# Patient Record
Sex: Male | Born: 1955 | Race: White | Hispanic: No
Health system: Midwestern US, Academic
[De-identification: ages and names within clinical notes are randomized; demographics above are authoritative.]

---

## 2017-01-06 ENCOUNTER — Encounter: Admit: 2017-01-06 | Discharge: 2017-01-06 | Payer: BC Managed Care – HMO

## 2017-01-06 ENCOUNTER — Encounter: Admit: 2017-01-06 | Discharge: 2017-01-06 | Payer: Private Health Insurance - Indemnity

## 2017-01-06 DIAGNOSIS — R5383 Other fatigue: ICD-10-CM

## 2017-01-06 DIAGNOSIS — I493 Ventricular premature depolarization: Principal | ICD-10-CM

## 2017-01-13 ENCOUNTER — Encounter: Admit: 2017-01-13 | Discharge: 2017-01-13 | Payer: BC Managed Care – HMO

## 2017-01-13 ENCOUNTER — Ambulatory Visit: Admit: 2017-01-13 | Discharge: 2017-01-14 | Payer: BC Managed Care – HMO

## 2017-01-13 DIAGNOSIS — I493 Ventricular premature depolarization: ICD-10-CM

## 2017-01-13 DIAGNOSIS — B2790 Infectious mononucleosis, unspecified without complication: ICD-10-CM

## 2017-01-13 DIAGNOSIS — R5383 Other fatigue: ICD-10-CM

## 2017-01-13 DIAGNOSIS — R002 Palpitations: Principal | ICD-10-CM

## 2017-01-13 NOTE — Progress Notes
Records Request    Medical records request for continuation of care:      [facility]  [address]  [place], [place]    Request records:    Echocardiogram CD- June 24, 2016    Thank you,      [facility]  [facility]  [address]  [place], [place] [id number]  Phone:  [phone]  Fax:  [phone]

## 2017-01-13 NOTE — Assessment & Plan Note
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  He will probably need a cardiac MR.  We may be able to effectively rule out CAD with this sort of imaging study; his risk for CAD as the cause of his PVC's would seem to be quite low.

## 2017-01-13 NOTE — Assessment & Plan Note
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  Beta-blocker medications have not been effective in suppressing symptoms.  While we could consider the use of a 1-C agent (after conclusively ruling out CAD) he may actually be a good candidate for a PVC ablation.  I will discuss his case with Dr. Betti Cruzeddy and get back with the patient about next-steps.

## 2017-01-13 NOTE — Progress Notes
Date of Service: 01/13/2017    Oscar Sweeney is a 61 y.o. male.       HPI     Oscar Sweeney was in the [place] office today for consultation regarding symptomatic PVCs.  He was accompanied by his future son-in-law, Dr. Lona Kettle.    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    About a month later he was out on his motorcycle on a relatively hot day when he noticed the onset of bothersome palpitation symptoms.  The palpitation symptoms have persisted and, in fact, have increased in frequency since that time.    He says that the PVCs tend to be brought on by exposure to hot weather.  He can get the PVCs in the winter months but he said he has to work really hard for them to start.  If he relaxes and tries to settle down shortly after the onset of palpitation symptoms the palpitations may resolve fairly quickly.  If he cannot rest and the PVCs progressed to the point that he is having what he describes as bigeminy, it may take hours for the palpitations to actually resolved.  He occasionally notes the PVC's if he gets up at night to urinate.    He says that he has a dry cough and feels like he has a lot of phlegm while he is having the frequent PVCs.  He has not had lightheadedness or syncope nor does he have any sort of anginal symptoms.  He says that right at the beginning of the PVCs he has the sensation that his lungs are filling with phlegm.    He has a remote history of palpitations while going through a divorce about 20 years ago but usually he does not have any cardiovascular symptoms at all.  He denies any problems with anginal chest discomfort.  His never had any sort of TIA or stroke symptoms.  Denies any history of syncope or near syncope.      He had been taking Ziac last year but was switched to [facility] in February when he had a Holter monitor that showed ~13% PVC burden.  He says that he actually tried to provoke the PVCs the day he wore the monitor and was a lot more active than usual.  For a while he was taking the Bystolic on an as-needed basis but Dr. Herschell Dimes suggested that he take it on a twice daily basis regularly and he has been doing this.  Despite the fact that he has been taking the beta-blocker pretty regularly he continues to have a lot of trouble with symptomatic PVCs.    He also had an echocardiogram in February that was essentially normal.  There was a comment about diastolic abnormality without quantification.    He was able to go climbing 14'ers in [place] this past summer with his daughter and son-in-law.  He took a lot more by Bystolic at the time (every 5 hours!) but did not have any particular problems with PVCs while climbing.    Yesterday the palpitations started at 4 PM.  He took his Bystolic at 6 and they still hadn't resolved by 8.  He took another [employer] development [employer] and by about 10 his symptoms had resolved.         Vitals:    01/13/17 1055 01/13/17 1103  BP: (!) 170/92 (!) 162/96   Pulse: 49    Weight: 75.8 kg (167 lb)    Height: 1.88 m (6' 2)      Body mass index is 21.44 kg/m???.     Past Medical History  Patient Active Problem List    Diagnosis Date Noted   ??? 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  Presented with fatigue.     ??? PVC's (premature ventricular contractions) 01/06/2017     2017 - Palpitation symptoms started several months after EB virus infection.  Ziac started initially.    06/24/2016  Echo:  EF 55%.  Diastolic function is abnormal.  Right atrium is enlarged.  Trace mitral regurg.  Minimal tricuspid regurg.  Mild pulmonic valvular regurg.  Aortic root is normal.  No pericardial effusion.  No pleural effusion present.     06/2016:  Bystolic substituted for Ziac.    06/22/2016:  24-hour Holter:  Predominant rhythm was a sinus rhythm with an average heart rate of 63 bpm.  Heart rates ranging from 45 bpm to 97 bpm.  Frequent PVCs with a 13.4 % burden.  Rare supraventricular ectopy.  No sustained arrhythmia.  No significant pauses.  Symptoms of palpitations corresponded to SR, at times with PVCs.  (Patient tried to provoke PVC's with increased activity).           Review of Systems   Constitution: Positive for malaise/fatigue.   HENT: Negative.    Eyes: Negative.    Cardiovascular: Positive for palpitations.   Respiratory: Positive for cough.    Endocrine: Positive for heat intolerance.   Hematologic/Lymphatic: Negative.    Skin: Negative.    Musculoskeletal: Negative.    Gastrointestinal: Negative.    Genitourinary: Negative.    Neurological: Negative.    Psychiatric/Behavioral: Negative.    Allergic/Immunologic: Negative.        Physical Exam    Physical Exam   General Appearance: no distress   Skin: warm, no ulcers or xanthomas   Digits and Nails: no cyanosis or clubbing   Eyes: conjunctivae and lids normal, pupils are equal and round   Teeth/Gums/Palate: dentition unremarkable, no lesions   Lips & Oral Mucosa: no pallor or cyanosis   Neck Veins: normal JVP , neck veins are not distended   Thyroid: no nodules, masses, tenderness or enlargement   Chest Inspection: chest is normal in appearance   Respiratory Effort: breathing comfortably, no respiratory distress   Auscultation/Percussion: lungs clear to auscultation, no rales or rhonchi, no wheezing   PMI: PMI not enlarged or displaced   Cardiac Rhythm: regular rhythm and normal rate   Cardiac Auscultation: S1, S2 normal, no rub, no gallop   Murmurs: no murmur   Peripheral Circulation: normal peripheral circulation   Carotid Arteries: normal carotid upstroke bilaterally, no bruits   Radial Arteries: normal symmetric radial pulses   Abdominal Aorta: no abdominal aortic bruit   Pedal Pulses: normal symmetric pedal pulses   Lower Extremity Edema: no lower extremity edema Abdominal Exam: soft, non-tender, no masses, bowel sounds normal   Liver & Spleen: no organomegaly   Gait & Station: walks without assistance   Muscle Strength: normal muscle tone   Orientation: oriented to time, place and person   Affect & Mood: appropriate and sustained affect   Language and Memory: patient responsive and seems to comprehend information   Neurologic Exam: neurological assessment grossly intact   Other: moves all extremities  Cardiovascular Studies    EKG:  Sinus rhythm, rate 49.  Normal EKG.    Problems Addressed Today  Encounter Diagnoses   Name Primary?   ??? Palpitations    ??? PVC's (premature ventricular contractions)    ??? 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  Beta-blocker medications have not been effective in suppressing symptoms.  While we could consider the use of a 1-C agent (after conclusively ruling out CAD) he may actually be a good candidate for a PVC ablation.  I will discuss his case with Dr. Betti Cruz and get back with the patient about next-steps.    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  He will probably need a cardiac MR.  We may be able to effectively rule out CAD with this sort of imaging study; his risk for CAD as the cause of his PVC's would seem to be quite low.      Current Medications (including today's revisions)  ??? ALPRAZolam (XANAX) 0.5 mg tablet Take 0.25 mg by mouth as Needed.   ??? aspirin EC 81 mg tablet Take 81 mg by mouth daily. Take with food.   ??? MULTIVITAMIN PO Take 1 tablet by mouth daily.   ??? nebivolol (BYSTOLIC) 10 mg tablet Take 10 mg by mouth daily.

## 2017-03-07 ENCOUNTER — Encounter: Admit: 2017-03-07 | Discharge: 2017-03-07 | Payer: BC Managed Care – HMO

## 2017-03-07 DIAGNOSIS — I493 Ventricular premature depolarization: Principal | ICD-10-CM

## 2017-03-07 MED ORDER — LIDOCAINE (PF) 10 MG/ML (1 %) IJ SOLN
.1-2 mL | INTRAMUSCULAR | 0 refills | Status: CN | PRN
Start: 2017-03-07 — End: ?

## 2017-03-07 MED ORDER — LIDOCAINE HCL 2 % MM JELP
Freq: Once | TOPICAL | 0 refills | Status: CN
Start: 2017-03-07 — End: ?

## 2017-03-07 MED ORDER — SODIUM CHLORIDE 0.9 % IV SOLP
INTRAVENOUS | 0 refills | Status: CN
Start: 2017-03-07 — End: ?

## 2017-03-08 ENCOUNTER — Encounter: Admit: 2017-03-08 | Discharge: 2017-03-08 | Payer: Private Health Insurance - Indemnity

## 2017-03-08 DIAGNOSIS — I493 Ventricular premature depolarization: Principal | ICD-10-CM

## 2017-03-08 NOTE — Progress Notes
10-30 [employer] 1 [phone] [id number] regarding [id number] spoke Lorel Monaco.   No Pre Cert needed REF# [id number] LM, patients effective date 05-10-2014

## 2017-03-18 ENCOUNTER — Encounter: Admit: 2017-03-18 | Discharge: 2017-03-18 | Payer: BC Managed Care – HMO

## 2017-03-18 ENCOUNTER — Ambulatory Visit: Admit: 2017-03-18 | Discharge: 2017-03-18 | Payer: BC Managed Care – HMO

## 2017-03-18 DIAGNOSIS — I493 Ventricular premature depolarization: Principal | ICD-10-CM

## 2017-03-18 DIAGNOSIS — R5383 Other fatigue: ICD-10-CM

## 2017-03-25 ENCOUNTER — Ambulatory Visit: Admit: 2017-03-25 | Discharge: 2017-03-26 | Payer: BC Managed Care – HMO

## 2017-03-25 ENCOUNTER — Encounter: Admit: 2017-03-25 | Discharge: 2017-03-25 | Payer: BC Managed Care – HMO

## 2017-03-25 DIAGNOSIS — I493 Ventricular premature depolarization: Principal | ICD-10-CM

## 2017-03-26 ENCOUNTER — Encounter: Admit: 2017-03-26 | Discharge: 2017-03-26 | Payer: Private Health Insurance - Indemnity

## 2017-03-27 NOTE — Progress Notes
Pt enrolled with cardio net. SN # listed for hook-up was not one of the two options on cardio net. I selected [id number].

## 2017-04-06 ENCOUNTER — Encounter: Admit: 2017-04-06 | Discharge: 2017-04-06 | Payer: Private Health Insurance - Indemnity

## 2017-04-14 ENCOUNTER — Encounter: Admit: 2017-04-14 | Discharge: 2017-04-14 | Payer: Private Health Insurance - Indemnity

## 2017-04-14 NOTE — Progress Notes
Overall normal Holter.  Minor issues with low burden of PVCs.  No arrhythmias noted many times even with symptoms.

## 2017-05-23 ENCOUNTER — Ambulatory Visit: Admit: 2017-05-23 | Discharge: 2017-05-24 | Payer: BC Managed Care – HMO

## 2017-05-23 ENCOUNTER — Encounter: Admit: 2017-05-23 | Discharge: 2017-05-23 | Payer: Private Health Insurance - Indemnity

## 2017-05-23 DIAGNOSIS — I493 Ventricular premature depolarization: Principal | ICD-10-CM

## 2017-05-23 DIAGNOSIS — R5383 Other fatigue: ICD-10-CM

## 2017-05-23 MED ORDER — FLECAINIDE 50 MG PO TAB
50 mg | ORAL_TABLET | Freq: Two times a day (BID) | ORAL | 3 refills | 30.00000 days | Status: AC
Start: 2017-05-23 — End: 2017-08-10

## 2017-05-23 MED ORDER — BISOPROLOL-HYDROCHLOROTHIAZIDE 5-6.25 MG PO TAB
1 | ORAL_TABLET | Freq: Every day | ORAL | 3 refills | Status: AC
Start: 2017-05-23 — End: ?

## 2017-05-27 ENCOUNTER — Encounter: Admit: 2017-05-27 | Discharge: 2017-05-27 | Payer: Private Health Insurance - Indemnity

## 2017-06-08 ENCOUNTER — Ambulatory Visit: Admit: 2017-06-08 | Discharge: 2017-06-08 | Payer: BC Managed Care – HMO

## 2017-06-08 DIAGNOSIS — I493 Ventricular premature depolarization: Principal | ICD-10-CM

## 2017-06-08 LAB — POC CREATININE, RAD: Lab: 0.8 mg/dL (ref 0.4–1.24)

## 2017-06-08 MED ORDER — SODIUM CHLORIDE 0.9 % IJ SOLN
50 mL | Freq: Once | INTRAVENOUS | 0 refills | Status: CP
Start: 2017-06-08 — End: ?
  Administered 2017-06-08: 16:00:00 50 mL via INTRAVENOUS

## 2017-06-08 MED ORDER — GADOBENATE DIMEGLUMINE 529 MG/ML (0.1MMOL/0.2ML) IV SOLN
30 mL | Freq: Once | INTRAVENOUS | 0 refills | Status: CP
Start: 2017-06-08 — End: ?
  Administered 2017-06-08: 16:00:00 30 mL via INTRAVENOUS

## 2017-06-14 ENCOUNTER — Encounter: Admit: 2017-06-14 | Discharge: 2017-06-14 | Payer: BC Managed Care – HMO

## 2017-06-16 ENCOUNTER — Encounter: Admit: 2017-06-16 | Discharge: 2017-06-16 | Payer: BC Managed Care – HMO

## 2017-06-17 ENCOUNTER — Encounter: Admit: 2017-06-17 | Discharge: 2017-06-17 | Payer: BC Managed Care – HMO

## 2017-08-10 ENCOUNTER — Ambulatory Visit: Admit: 2017-08-10 | Discharge: 2017-08-11 | Payer: BC Managed Care – HMO

## 2017-08-10 ENCOUNTER — Encounter: Admit: 2017-08-10 | Discharge: 2017-08-10 | Payer: Private Health Insurance - Indemnity

## 2017-08-10 DIAGNOSIS — I493 Ventricular premature depolarization: Principal | ICD-10-CM

## 2017-08-10 DIAGNOSIS — R5383 Other fatigue: ICD-10-CM

## 2017-08-10 MED ORDER — FLECAINIDE 50 MG PO TAB
75 mg | ORAL_TABLET | Freq: Two times a day (BID) | ORAL | 3 refills | 30.00000 days | Status: AC
Start: 2017-08-10 — End: 2017-11-16

## 2017-11-16 ENCOUNTER — Encounter: Admit: 2017-11-16 | Discharge: 2017-11-16 | Payer: BC Managed Care – HMO

## 2017-11-16 ENCOUNTER — Ambulatory Visit: Admit: 2017-11-16 | Discharge: 2017-11-17 | Payer: BC Managed Care – HMO

## 2017-11-16 DIAGNOSIS — I493 Ventricular premature depolarization: Principal | ICD-10-CM

## 2017-11-16 DIAGNOSIS — R002 Palpitations: ICD-10-CM

## 2017-11-16 DIAGNOSIS — R5383 Other fatigue: ICD-10-CM

## 2017-11-16 MED ORDER — FLECAINIDE 100 MG PO TAB
100 mg | ORAL_TABLET | Freq: Two times a day (BID) | ORAL | 3 refills | 30.00000 days | Status: AC
Start: 2017-11-16 — End: 2018-11-08

## 2018-05-26 IMAGING — CT Abdomen^1_ABDOMEN_PELVIS_WITH (Adult)
1 series · 15 of 32 positions shown, 19 images · non-contrast
Comparison: none

[Series 2: abd/pelvis with 5.0 soft tissue · axial · 0.72mm/px · z∈[-462,-72]mm · 15 of 87 slices shown, 19 images]
[im 6/87  soft-tissue]
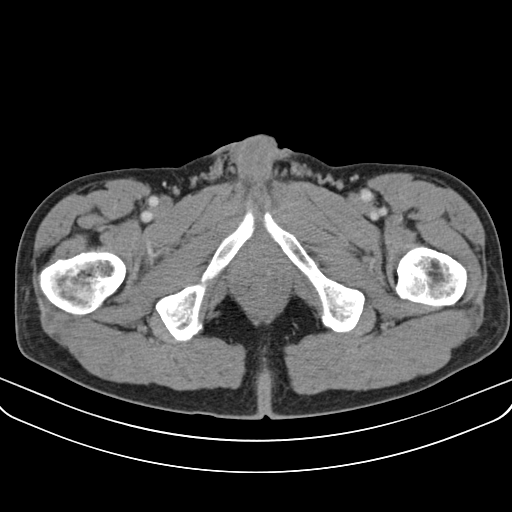
[im 6/87  bone]
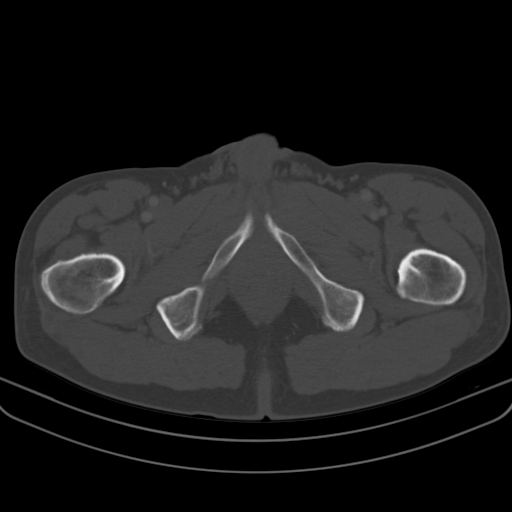
[im 12/87  soft-tissue]
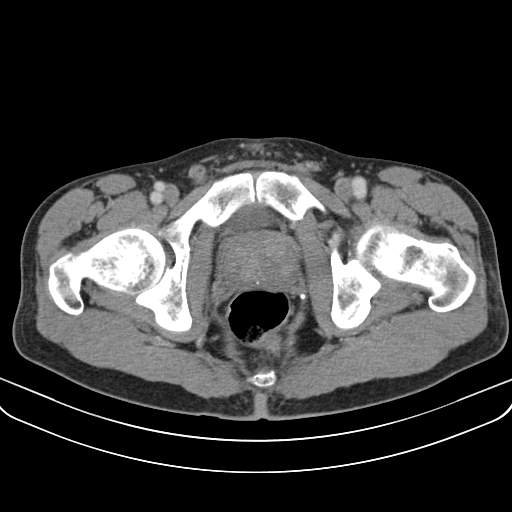
[im 17/87  soft-tissue]
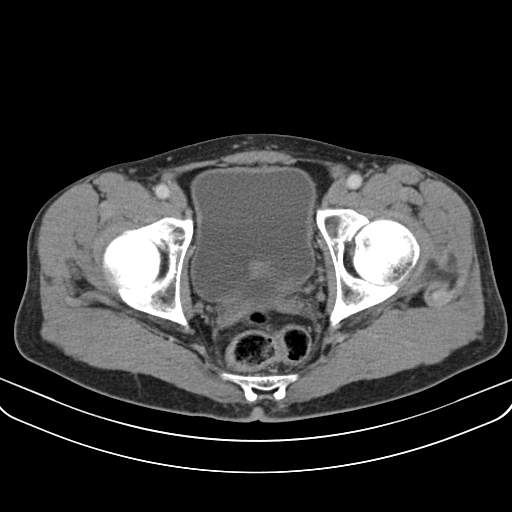
[im 25/87  soft-tissue]
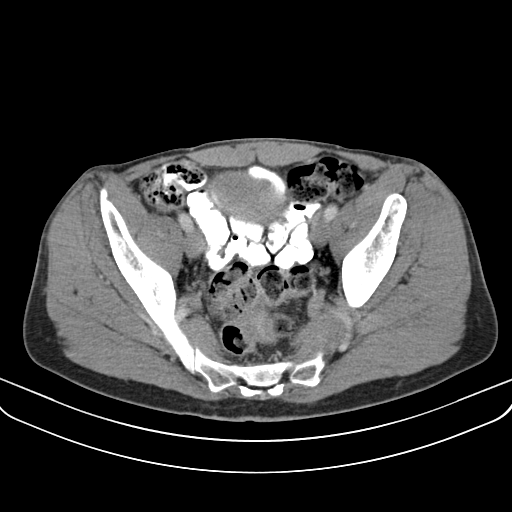
[im 31/87  soft-tissue]
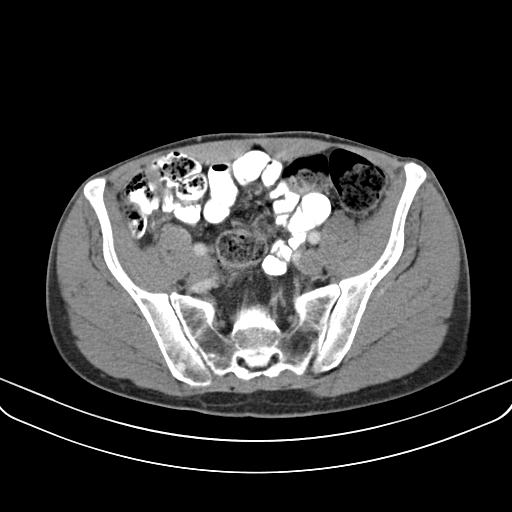
[im 37/87  soft-tissue]
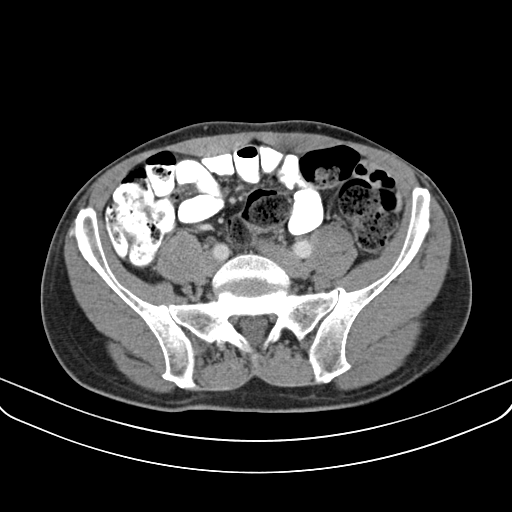
[im 45/87  soft-tissue]
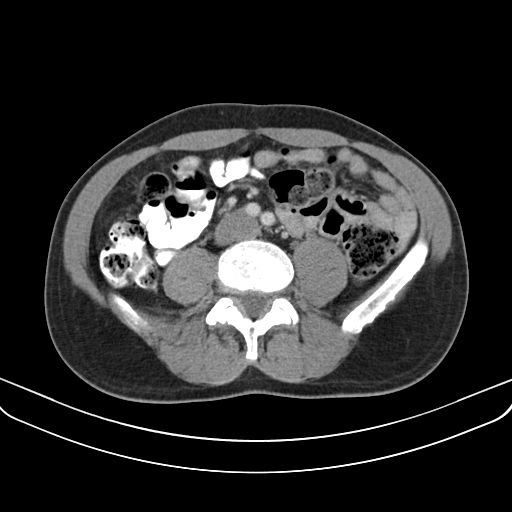
[im 50/87  soft-tissue]
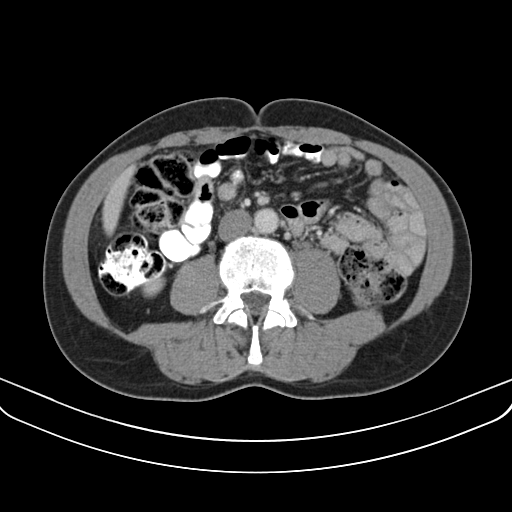
[im 56/87  soft-tissue]
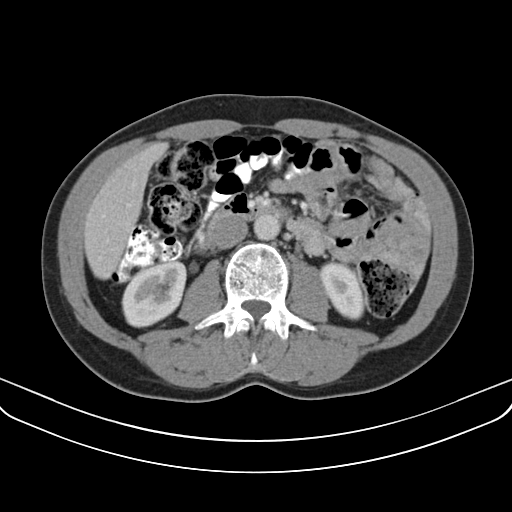
[im 56/87  bone]
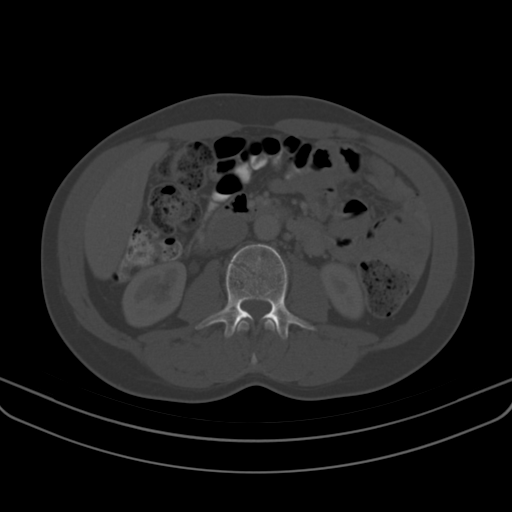
[im 62/87  soft-tissue]
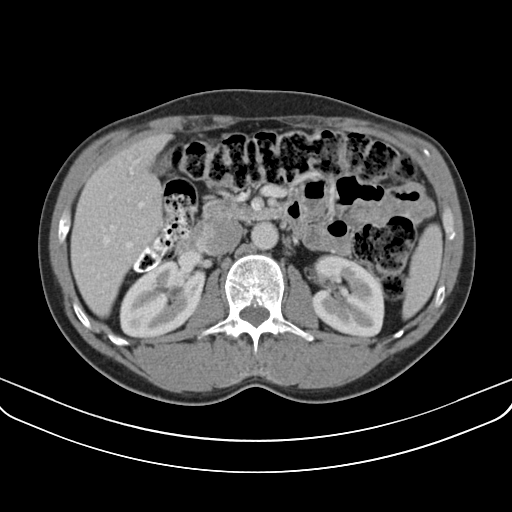
[im 70/87  soft-tissue]
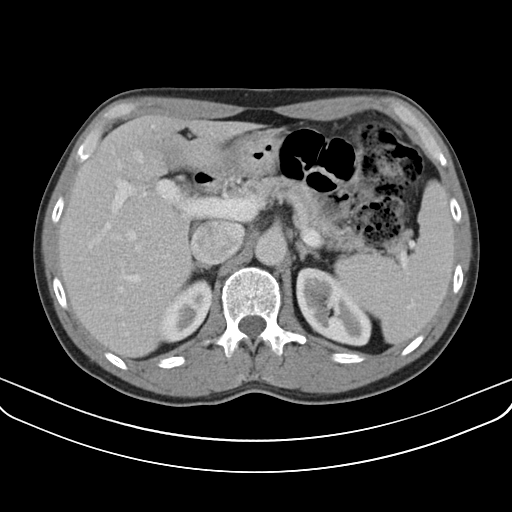
[im 75/87  soft-tissue]
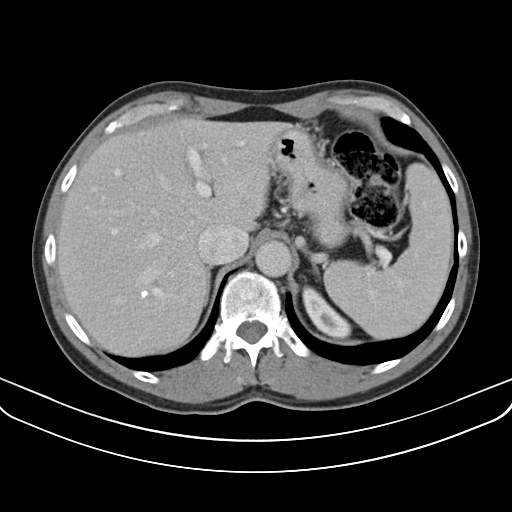
[im 75/87  lung]
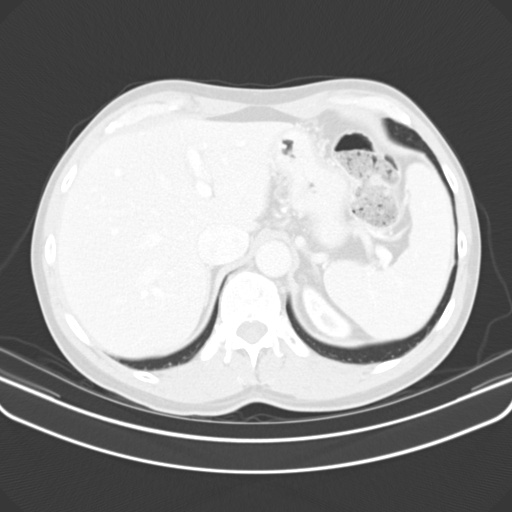
[im 78/87  lung]
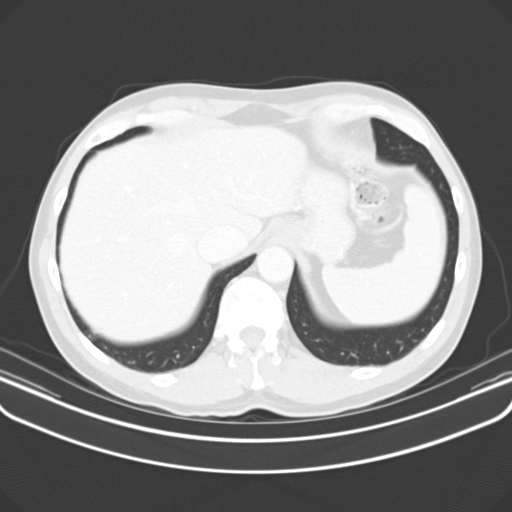
[im 81/87  soft-tissue]
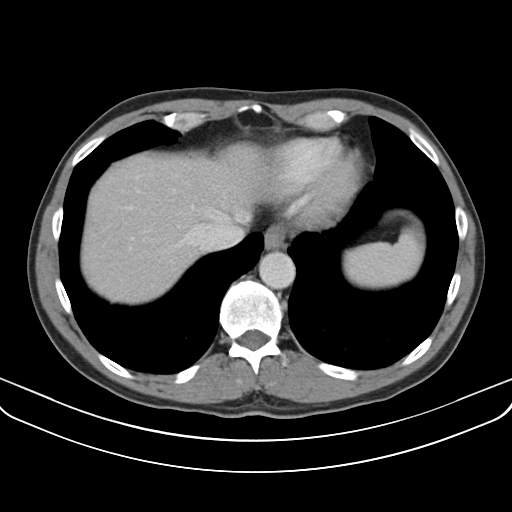
[im 81/87  lung]
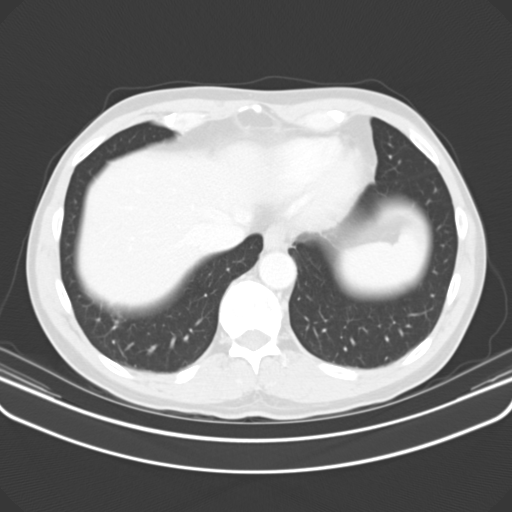
[im 84/87  lung]
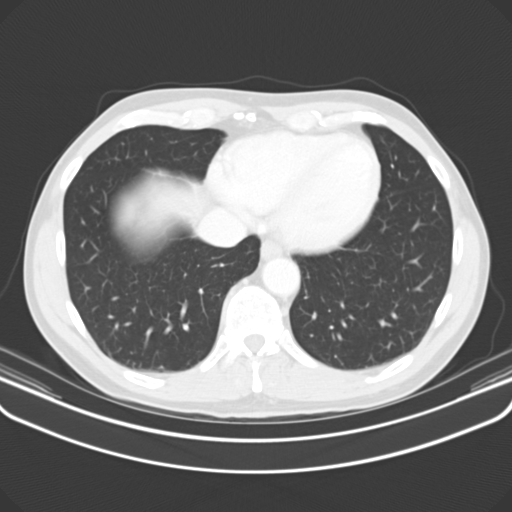

[15 of 32 positions shown; findings below may reference images not displayed]

DIAGNOSTIC STUDIES

EXAM

CT scan the abdomen pelvis with contrast.

INDICATION

LLQ pain
LLQ PAIN, LUQ FULLNESS, STARTED A FEW DAYS AGO.  NO SX HX.  NO NAUSEA OR VOMITING.  100 ML
OMNIPAQUE 300

TECHNIQUE

All CT scans at this facility use dose modulation, iterative reconstruction, and/or weight based
dosing when appropriate to reduce radiation dose to as low as reasonably achievable.

COMPARISONS

None available

FINDINGS

Lung bases are clear. Small hypodensity within the left lobe of the liver and likely represents a
small hemangioma or cyst. Calcified granuloma is noted in the right lobe of the liver. No concerning
hepatic lesions are seen. No calcified gallstones are noted. The spleen is upper limits of normal in
size measuring 12 centimeters craniocaudad.
The adrenal glands are normal. Kidneys are unremarkable. The pancreas is normal. No enlarged
retroperitoneal lymph nodes are seen.
The aorta and abdominal pelvic vasculature within normal limits. The small and large bowel are
normal in caliber. No ascites or mesenteric adenopathy is seen.

Prostate is mildly enlarged. Adenopathy or abnormal fluid collections are seen within the pelvis.
No concerning osseous lesions are is evident.

IMPRESSION

No acute inflammatory process throughout the abdomen pelvis. The spleen is upper limits of normal
in size and probable small cyst is evident in the left lobe of the liver.

Slight prominence of prostate. Correlation with PSA values may be beneficial.

Tech Notes:
OMNIPAQUE 300

## 2018-11-08 ENCOUNTER — Encounter: Admit: 2018-11-08 | Discharge: 2018-11-08

## 2018-11-08 MED ORDER — FLECAINIDE 100 MG PO TAB
ORAL_TABLET | Freq: Two times a day (BID) | ORAL | 0 refills | 30.00000 days | Status: DC
Start: 2018-11-08 — End: 2018-11-27

## 2018-11-19 ENCOUNTER — Encounter: Admit: 2018-11-19 | Discharge: 2018-11-19

## 2018-11-20 NOTE — Progress Notes
Request for the following medical records for purpose of continuity of care:   Has an appointment with RAD on 11/24/18    Please send most recent labs.      Please Fax to:  [facility] - [phone]  Dr. Arrie Eastern  Attention:  Lorre Nick, RN    Thank you

## 2018-11-24 ENCOUNTER — Encounter: Admit: 2018-11-24 | Discharge: 2018-11-24

## 2018-11-24 ENCOUNTER — Ambulatory Visit: Admit: 2018-11-24 | Discharge: 2018-11-25

## 2018-11-24 DIAGNOSIS — I493 Ventricular premature depolarization: Secondary | ICD-10-CM

## 2018-11-24 DIAGNOSIS — R5383 Other fatigue: Secondary | ICD-10-CM

## 2018-11-24 NOTE — Telephone Encounter
-----   Message from Louis Meckel, LPN sent at 4/38/8875 10:46 AM CDT -----  Regarding: RAD- RC for Oscar Sweeney  VM from patient on triage line returning call for [facility].  Oscar Sweeney that he needs his medications sent to the [facility] in [place], [place].

## 2018-11-24 NOTE — Patient Instructions
We would like you to follow up in  1 year.    Please schedule the following testing: Treadmill in 1 year.     In order to provide you the best care possible we ask that you follow up as below:    For NON-URGENT questions please contact us through your MyChart account.   For all medication refills please contact your pharmacy or send a request through [place].   For all questions that may need to be addressed urgently please call the nursing triage line at [phone] Monday - Friday 8-5 only. Please leave a detailed message with your name, date of birth, and reason for your call.    To schedule an appointment call [phone].     Please allow 10-15 business days for the results of any testing to be reviewed. Please call our office if you have not heard from a nurse within this time frame.  For up to date information on the COVID-19 virus, visit the [facility] website. [email or web address]   General supportive care during cold and flu season and infection prevention reminders:    o Wash hands often with soap and water for at least 20 seconds   o Cover your mouth and nose   o Social distancing: try to maintain 6 feet between you and other people   o Stay home if sick and symptoms mild or manageable?   If you must be around people wear a mask     If you are having symptoms of a lower respiratory infection (cough, shortness of breath) and/or fever AND either traveled in last 30 days (internationally or to region of exposure) OR known exposure to patient with COVID19:     o Call your primary care provider for questions or health needs.    Tell your doctor about your recent travel and your symptoms     o In a medical emergency, call 911 or go to the nearest emergency room.

## 2018-11-24 NOTE — Telephone Encounter
RAD- RC for Tomi Bamberger   Received: Today   Message Contents   Louis Meckel, LPN  P Cvm Nurse Ep            VM from patient on triage line returning our call.   Said that he needs medication sent to [facility], phone [phone].      Returned call.  I informed the patient that we got his msgs & that I would inform Tomi Bamberger.  He verbalized understanding.    Tomi Bamberger informed.

## 2018-11-25 ENCOUNTER — Encounter: Admit: 2018-11-25 | Discharge: 2018-11-25

## 2018-11-25 DIAGNOSIS — R5383 Other fatigue: Secondary | ICD-10-CM

## 2018-11-25 DIAGNOSIS — I493 Ventricular premature depolarization: Secondary | ICD-10-CM

## 2018-11-27 ENCOUNTER — Encounter: Admit: 2018-11-27 | Discharge: 2018-11-27

## 2018-11-27 MED ORDER — FLECAINIDE 100 MG PO TAB
100 mg | ORAL_TABLET | Freq: Two times a day (BID) | ORAL | 0 refills | 30.00000 days | Status: DC
Start: 2018-11-27 — End: 2018-11-28

## 2018-11-27 MED ORDER — FLECAINIDE 100 MG PO TAB
100 mg | ORAL_TABLET | Freq: Two times a day (BID) | ORAL | 0 refills | Status: CN
Start: 2018-11-27 — End: ?

## 2018-11-28 ENCOUNTER — Encounter: Admit: 2018-11-28 | Discharge: 2018-11-28

## 2018-11-28 MED ORDER — FLECAINIDE 100 MG PO TAB
100 mg | ORAL_TABLET | Freq: Two times a day (BID) | ORAL | 4 refills | 30.00000 days | Status: AC
Start: 2018-11-28 — End: ?

## 2019-12-24 ENCOUNTER — Encounter: Admit: 2019-12-24 | Discharge: 2019-12-24

## 2019-12-24 NOTE — Progress Notes
Records Request    Medical records request for continuation of care:    Patient has appointment on 8.20.21   with  Dr. Wallene Huh .    Please fax records to [facility] [place] [facility] [phone]    Request records:        Recent Labs      Thank you,      Cardiovascular Medicine  [facility]  [address]  [place], [place] [address]  Phone:  [phone]  Fax:  [phone]

## 2019-12-25 ENCOUNTER — Encounter: Admit: 2019-12-25 | Discharge: 2019-12-25

## 2019-12-28 ENCOUNTER — Encounter: Admit: 2019-12-28 | Discharge: 2019-12-28

## 2019-12-28 ENCOUNTER — Ambulatory Visit: Admit: 2019-12-28 | Discharge: 2019-12-28

## 2019-12-28 DIAGNOSIS — I493 Ventricular premature depolarization: Secondary | ICD-10-CM

## 2019-12-28 DIAGNOSIS — R5383 Other fatigue: Secondary | ICD-10-CM

## 2019-12-28 MED ORDER — FLECAINIDE 100 MG PO TAB
100 mg | ORAL_TABLET | Freq: Two times a day (BID) | ORAL | 4 refills | 30.00000 days | Status: AC
Start: 2019-12-28 — End: ?

## 2020-01-02 ENCOUNTER — Encounter: Admit: 2020-01-02 | Discharge: 2020-01-02 | Payer: Private Health Insurance - Indemnity

## 2020-01-02 MED ORDER — FLECAINIDE 100 MG PO TAB
ORAL_TABLET | Freq: Two times a day (BID) | ORAL | 3 refills | 30.00000 days | Status: AC
Start: 2020-01-02 — End: ?

## 2021-01-22 ENCOUNTER — Encounter: Admit: 2021-01-22 | Discharge: 2021-01-22 | Payer: Private Health Insurance - Indemnity

## 2021-01-22 NOTE — Progress Notes
Records Request    Medical records request for continuation of care:    Patient has appointment  with  Dr. Wallene Huh .    Please fax records to [facility] [place] [facility] [phone]    Request records:      Most Recent Labs (last we have are from June 2021)        Thank you,      Cardiovascular Medicine  [facility]  [address]  [place], [place] [address]  Phone:  [phone]  Fax:  [phone]

## 2021-01-23 ENCOUNTER — Encounter: Admit: 2021-01-23 | Discharge: 2021-01-23 | Payer: Private Health Insurance - Indemnity

## 2021-01-28 ENCOUNTER — Encounter: Admit: 2021-01-28 | Discharge: 2021-01-28 | Payer: MEDICARE

## 2021-01-28 DIAGNOSIS — R0989 Other specified symptoms and signs involving the circulatory and respiratory systems: Secondary | ICD-10-CM

## 2021-01-28 DIAGNOSIS — I493 Ventricular premature depolarization: Secondary | ICD-10-CM

## 2021-01-28 DIAGNOSIS — R5383 Other fatigue: Secondary | ICD-10-CM

## 2021-01-28 MED ORDER — BISOPROLOL-HYDROCHLOROTHIAZIDE 5-6.25 MG PO TAB
1 | ORAL_TABLET | Freq: Every day | ORAL | 3 refills | Status: AC
Start: 2021-01-28 — End: ?

## 2021-01-28 MED ORDER — FLECAINIDE 100 MG PO TAB
100 mg | ORAL_TABLET | Freq: Two times a day (BID) | ORAL | 3 refills | 30.00000 days | Status: AC
Start: 2021-01-28 — End: ?

## 2021-02-03 ENCOUNTER — Encounter: Admit: 2021-02-03 | Discharge: 2021-02-03 | Payer: MEDICARE

## 2021-02-03 DIAGNOSIS — R5383 Other fatigue: Secondary | ICD-10-CM

## 2021-02-03 DIAGNOSIS — I493 Ventricular premature depolarization: Secondary | ICD-10-CM

## 2022-05-20 ENCOUNTER — Encounter: Admit: 2022-05-20 | Discharge: 2022-05-20 | Payer: MEDICARE

## 2022-05-20 NOTE — Progress Notes
Records Request    Medical records request for continuation of care:    Patient has appointment with  Dr. Arrie Eastern    Please fax records to [facility] [place]    Request records:    Oscar Sweeney  09-01-1955      Recent Labs        Thank you,      [place] [facility]  [address] [place] [address]  Phone:  [phone]  Fax:  [phone]

## 2022-05-25 ENCOUNTER — Encounter: Admit: 2022-05-25 | Discharge: 2022-05-25 | Payer: MEDICARE

## 2022-05-26 ENCOUNTER — Encounter: Admit: 2022-05-26 | Discharge: 2022-05-26 | Payer: MEDICARE

## 2022-05-26 DIAGNOSIS — I10 Essential (primary) hypertension: Secondary | ICD-10-CM

## 2022-05-26 DIAGNOSIS — I493 Ventricular premature depolarization: Secondary | ICD-10-CM

## 2022-05-26 DIAGNOSIS — R0989 Other specified symptoms and signs involving the circulatory and respiratory systems: Secondary | ICD-10-CM

## 2022-05-26 DIAGNOSIS — Z136 Encounter for screening for cardiovascular disorders: Secondary | ICD-10-CM

## 2022-05-26 DIAGNOSIS — R5383 Other fatigue: Secondary | ICD-10-CM

## 2022-05-26 MED ORDER — FLECAINIDE 100 MG PO TAB
100 mg | ORAL_TABLET | Freq: Two times a day (BID) | ORAL | 1 refills | 30.00000 days | Status: AC
Start: 2022-05-26 — End: ?

## 2022-05-26 NOTE — Patient Instructions
Follow up in one year.       Thank you for allowing [place] to participate in your care!    For NON-URGENT questions please contact us through your MyChart account.   For all medication refills please contact your pharmacy or send a request through [place].     For all questions that may need to be addressed urgently please call the nursing triage line at [phone] Monday - Friday 8am-3pm only.  Messages left after this timeframe will be addressed the next business day. Please leave a detailed message with your name, date of birth, and reason for your call. This number is not to be used for emergencies.    To schedule an appointment with an electrophysiology (EP) provider, call [phone], for general cardiology call [phone].     Please allow 10-14 business days for the results of any testing to be reviewed. Please call our office if you have not heard from a nurse within this time frame.    Lab and test results:  As a part of the CARES act, starting 08/09/2019, some results will be released to you via mychart immediately and automatically.  You may see results before your provider sees them; however, your provider will review all these results and then they, or one of their team, will notify you of result information and recommendations.   Critical results will be addressed immediately, but otherwise, please allow [place] time to get back with you prior to you reaching out to [place] for questions.  This will usually take about 72 hours for labs and 5-7 days for procedure test results.

## 2022-11-16 ENCOUNTER — Encounter: Admit: 2022-11-16 | Discharge: 2022-11-16 | Payer: MEDICARE

## 2022-11-16 MED ORDER — FLECAINIDE 100 MG PO TAB
100 mg | ORAL_TABLET | Freq: Two times a day (BID) | ORAL | 0 refills | 30.00000 days | Status: AC
Start: 2022-11-16 — End: ?

## 2023-02-09 ENCOUNTER — Encounter: Admit: 2023-02-09 | Discharge: 2023-02-09 | Payer: MEDICARE

## 2023-02-09 MED ORDER — FLECAINIDE 100 MG PO TAB
100 mg | ORAL_TABLET | Freq: Two times a day (BID) | ORAL | 0 refills | 30.00000 days | Status: AC
Start: 2023-02-09 — End: ?

## 2023-05-06 ENCOUNTER — Encounter: Admit: 2023-05-06 | Discharge: 2023-05-06 | Payer: MEDICARE

## 2023-05-06 MED ORDER — FLECAINIDE 100 MG PO TAB
100 mg | ORAL_TABLET | Freq: Two times a day (BID) | ORAL | 0 refills | 30.00000 days | Status: AC
Start: 2023-05-06 — End: ?

## 2023-06-06 ENCOUNTER — Encounter: Admit: 2023-06-06 | Discharge: 2023-06-06 | Payer: MEDICARE

## 2023-06-06 NOTE — Progress Notes
Records Request    Medical records request for continuation of care:  Oscar Sweeney - DOB October 28, 1955    Patient has appointment with Dr. Wallene Huh     Please fax records to Cardiovascular Medicine [place] [facility] [phone]    Request records: Oscar Sweeney - DOB April 20, 1956        Recent Labs      Thank you,      Cardiovascular Medicine  [facility]  [address]  [place], [place] [address]  Phone:  [phone]  Fax:  [phone]

## 2023-07-28 ENCOUNTER — Encounter: Admit: 2023-07-28 | Discharge: 2023-07-28 | Payer: MEDICARE

## 2023-08-05 ENCOUNTER — Encounter: Admit: 2023-08-05 | Discharge: 2023-08-05

## 2023-08-05 ENCOUNTER — Ambulatory Visit: Admit: 2023-08-05 | Discharge: 2023-08-06

## 2023-08-05 DIAGNOSIS — R0989 Other specified symptoms and signs involving the circulatory and respiratory systems: Secondary | ICD-10-CM

## 2023-08-05 DIAGNOSIS — I493 Ventricular premature depolarization: Secondary | ICD-10-CM

## 2023-08-05 DIAGNOSIS — I10 Essential (primary) hypertension: Secondary | ICD-10-CM

## 2023-08-05 MED ORDER — FLECAINIDE 100 MG PO TAB
100 mg | ORAL_TABLET | Freq: Two times a day (BID) | ORAL | 3 refills | 30.00000 days | Status: AC
Start: 2023-08-05 — End: ?

## 2023-08-05 NOTE — Progress Notes
 Date of Service: 08/05/2023    Oscar Sweeney is a 68 y.o. male.       HPI     Oscar Sweeney was seen in the office today in electrophysiology follow-up. As you may know, he is a 68 y.o. male, with past medical history including frequent PVCs, hypertension, thrombocytopenia, and postviral syndrome (Epstein-Barr).  He follows with Dr. Edwinna Areola as well as Dr. Wallene Huh.    He presents to clinic today in annual follow-up.    His PVCs date back to at least early 2018.  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  Subsequently, he developed various symptoms including sensitivity to heat and palpitations.  He had been started on Bystolic which was uptitrated for PVC suppression.  A prior Holter monitor revealed a PVC burden of 13%.  In November 2018 he was started on Flecainide for PVC suppression.  He had a cardiac MRI which was negative for any infiltrative process.  He did try to wean off Flecainide but noted significantly increased symptoms and so it was resumed.  He did have COVID-19 infection in November 2020.  He underwent treadmill stress test in August 2021 which was nonischemic.    He last met with Dr. Wallene Huh in mid January 2024.  PVCs were well-controlled.  Blood pressure, which had been high in the past, was also well-controlled.  He was continued on Flecainide 100mg  twice daily and Ziac (Bisoprolol-HCTZ).          Today in clinic he reports no significant change over the last year.  He still will notice occasional PVCs but typically this is prior to his next dose of Flecainide.  He continues to maintain very physically active in his daily life.  His blood pressure is elevated today but he believes this is more whitecoat as well as a little bit of stress.  Prior blood pressure readings have been very well-controlled.  ECG shows sinus rhythm 54 bpm.    He had outside blood work in December which showed a potassium of 4.4, creatinine of 0.8, TSH 1.7, hemoglobin 15.4, platelets 137, and magnesium 2.1.         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  Presented with fatigue.      PVC's (premature ventricular contractions) 01/06/2017     2017 - Palpitation symptoms started several months after EB virus infection.  Ziac started initially.    06/24/2016  Echo:  EF 55%.  Diastolic function is abnormal.  Right atrium is enlarged.  Trace mitral regurg.  Minimal tricuspid regurg.  Mild pulmonic valvular regurg.  Aortic root is normal.  No pericardial effusion.  No pleural effusion present.     **I reviewed the actual images from this study and I believe that it is normal.  Diastolic parameters are borderline normal, but I think his diastolic function is normal.  I think RA dimensions are normal as well.  SDO  **    06/2016:  Bystolic substituted for Ziac.    06/22/2016:  24-hour Holter:  Predominant rhythm was a sinus rhythm with an average heart rate of 63 bpm.  Heart rates ranging from 45 bpm to 97 bpm.  Frequent PVCs with a 13.4 % burden.  Rare supraventricular ectopy.  No sustained arrhythmia.  No significant pauses.  Symptoms of palpitations corresponded to SR, at times with PVCs.  (Patient tried to provoke PVC's with increased activity).    03/25/2017 Holter-Predominantly normal sinus rhythm.Low burden PVCs.  1.7%Multiple symptoms reported.  Some of them correlate with PVCs and some of them do not have any correlating arrhythmia  Lump in the throat symptoms reported repeatedly.  06/08/2017 Cardiac MRI- Mildly enlarged left atrium.   Mildly enlarged left ventricle with normal left ventricular wall thickness and normal ejection fraction of 58%.   No abnormal delayed myocardial enhancement.   Mildly enlarged right atrium and minimally enlarged right ventricle with normal right ventricular wall thickness and ejection fraction of 67%.   Normal caliber aortic root and ascending aorta.     12/28/2019 - Treadmill Stress Test:  He has good exercise capacity.  His peak heart rate reached 140 beats per minute and no evidence of ischemia or arrhythmia noted.   Minimal QRS widening noted.            Review of Systems   Constitutional: Negative.   HENT: Negative.     Eyes: Negative.    Cardiovascular: Negative.    Respiratory: Negative.     Endocrine: Negative.    Hematologic/Lymphatic: Negative.    Skin: Negative.    Musculoskeletal: Negative.    Gastrointestinal: Negative.    Genitourinary: Negative.    Neurological: Negative.    Psychiatric/Behavioral: Negative.     Allergic/Immunologic: Negative.        Physical Exam   Vitals reviewed.  HENT:   Head: Normocephalic and atraumatic.   Eyes: No scleral icterus.   Cardiovascular: Normal rate, regular rhythm and normal heart sounds.   Pulmonary/Chest: Effort normal and breath sounds normal.   Abdominal: Normal appearance.   Musculoskeletal:         General: Normal range of motion.      Cervical back: Normal range of motion.   Neurological: He is alert and oriented to person, place, and time.   Skin: Skin is warm and dry.   Psychiatric: Mood, judgment and thought content normal.     Cardiovascular Studies  Preliminary ECG today shows sinus rhythm 54 bpm, PR 208 ms, QRS 106 ms, and QT 462 ms.    Cardiovascular Health Factors  Vitals BP Readings from Last 3 Encounters:   08/05/23 (!) 168/94   05/26/22 128/78   01/28/21 138/80     Wt Readings from Last 3 Encounters:   08/05/23 74.4 kg (164 lb)   05/26/22 76.2 kg (168 lb)   01/28/21 74.8 kg (165 lb)     BMI Readings from Last 3 Encounters:   08/05/23 21.20 kg/m?   05/26/22 21.57 kg/m?   01/28/21 21.18 kg/m?      Smoking Social History     Tobacco Use   Smoking Status Never   Smokeless Tobacco Never      Lipid Profile Cholesterol   Date Value Ref Range Status   12/11/2021 156  Final     HDL   Date Value Ref Range Status   12/11/2021 78  Final     LDL   Date Value  Ref Range Status   12/11/2021 61  Final     Triglycerides   Date Value Ref Range Status   12/11/2021 84  Final      Blood Sugar No results found for: HGBA1C  Glucose   Date Value Ref Range Status   05/05/2023 100  Final   12/11/2021 100  Final   12/08/2020 99  Final          Problems Addressed Today  Encounter Diagnoses   Name Primary?    Cardiovascular symptoms Yes    Essential hypertension     PVC's (premature ventricular contractions)        Assessment and Plan     PVCs  He remains on Flecainide 100 mg twice daily for PVC suppression.  He notices occasional PVCs but this is typically if he is late on his Flecainide dose, or just prior to the next dose.  He had lab work drawn in December as noted above.  His last stress test was in 2021.    Hypertension  Blood pressure elevated today in clinic at 168/94.  He believes this may be a little bit of whitecoat syndrome as well as stressing about his prescription length.  Prior measurements have been well-controlled.  He was asked to check his blood pressure at home over the next couple of weeks and if remains elevated above 145/90 that he should let us know.    I have asked him to follow-up with Dr. Wallene Huh in one year    Sharman Cheek, APRN-C            Current Medications (including today's revisions)   ALPRAZolam (XANAX) 0.5 mg tablet Take one-half tablet by mouth as Needed.    aspirin EC 81 mg tablet Take one tablet by mouth daily. Take with food.    bisoprolol-hydrochlorothiazide (ZIAC) 5/6.25 mg tablet Take one tablet by mouth daily.    flecainide (TAMBOCOR) 100 mg tablet Take one tablet by mouth twice daily. LABS DUE ORDERS IN [facility]. PLEASE CALL us AT [phone] IF YOU NEED TO DISCUSS.    MULTIVITAMIN PO Take 1 tablet by mouth daily.
